# Patient Record
Sex: Female | Born: 1980 | Race: White | Hispanic: No | Marital: Single | Smoking: Current every day smoker
Health system: Southern US, Community
[De-identification: ages and names within clinical notes are randomized; demographics above are authoritative.]

## PROBLEM LIST (undated history)

## (undated) DIAGNOSIS — Z8669 Personal history of other diseases of the nervous system and sense organs: Secondary | ICD-10-CM

## (undated) DIAGNOSIS — E162 Hypoglycemia, unspecified: Secondary | ICD-10-CM

---

## 2018-03-23 ENCOUNTER — Encounter (HOSPITAL_COMMUNITY): Payer: Self-pay | Admitting: *Deleted

## 2018-03-23 ENCOUNTER — Emergency Department (HOSPITAL_COMMUNITY)
Admission: EM | Admit: 2018-03-23 | Discharge: 2018-03-23 | Disposition: A | Discharge status: Home / Self Care | Payer: Self-pay | Attending: Emergency Medicine | Admitting: Emergency Medicine

## 2018-03-23 ENCOUNTER — Other Ambulatory Visit: Payer: Self-pay

## 2018-03-23 DIAGNOSIS — H60331 Swimmer's ear, right ear: Secondary | ICD-10-CM | POA: Insufficient documentation

## 2018-03-23 DIAGNOSIS — F17210 Nicotine dependence, cigarettes, uncomplicated: Secondary | ICD-10-CM | POA: Insufficient documentation

## 2018-03-23 HISTORY — DX: Personal history of other diseases of the nervous system and sense organs: Z86.69

## 2018-03-23 HISTORY — DX: Hypoglycemia, unspecified: E16.2

## 2018-03-23 MED ORDER — CIPROFLOXACIN-DEXAMETHASONE 0.3-0.1 % OT SUSP
4.0000 [drp] | Freq: Three times a day (TID) | OTIC | Status: AC
  Administered 2018-03-23: 4 [drp] via OTIC
  Filled 2018-03-23: qty 7.5

## 2018-03-23 NOTE — ED Triage Notes (Addendum)
Pt c/o right ear pain, itching, decreased hearing progressing since Wednesday. Pt reports the pain was so bad last night she was dizzy and nauseated, pt denies these symptoms now.

## 2018-03-23 NOTE — Discharge Instructions (Signed)
Ciprodex drops - 4 drops in the right ear every 8 hours for 7 days Read attached instructions  [facility] Doctor List    Kari BaarsEdward Hawkins MD. Specialty: Pulmonary Disease Contact information: [address]  [address]  [place] [address]  [phone]   Syliva OvermanMargaret Simpson, MD. Specialty: [facility] Contact information: [address]  [place] [address]  [phone]   Lilyan PuntScott Luking, MD. Specialty: [facility] Contact information: [address]  [address]  [place] [address]  [phone]   Avon Gullyesfaye Fanta, MD Specialty: Internal Medicine Contact information: [address]  [place] [address]  [phone]   Catalina PizzaZach Hall, MD. Specialty: Internal Medicine Contact information: [address]  [place] [address]  [phone]    [facility] (Dr. Selena BattenKim) Specialty: Family Medicine Contact information: [address]  [place] [address]  [phone]   John GiovanniStephen Knowlton, MD. Specialty: [facility] Contact information: [address]  [address]  [place] [address]  [phone]   Carylon Perchesoy Fagan, MD. Specialty: Internal Medicine Contact information: [address]  [address]  [place] [address]  [phone]    [facility]  [address] [place] [address] [phone]  Services [facility] offers a variety of basic health services.  Services include but are not limited to: Blood pressure checks  Heart rate checks  Blood sugar checks  Urine analysis  Rapid strep tests  Pregnancy tests.  Health education and referrals  People needing more complex services will be directed to a physician online. Using these virtual visits, doctors can evaluate and prescribe medicine and treatments. There will be no medication on-site, though [facility] will help patients fill their prescriptions at little to no  cost.   For More information please go to: [email or web address]

## 2018-03-23 NOTE — ED Notes (Signed)
ED Provider at bedside. 

## 2018-03-23 NOTE — ED Provider Notes (Signed)
[facility] EMERGENCY DEPARTMENT Provider Note   CSN: [id number] Arrival date & time: 03/23/18  [id number]     History   Chief Complaint Chief Complaint  Patient presents with  . Otalgia    HPI Cassie BergeronJennifer Andrews is a 37 y.o. female.  HPI  37 year old female who has had pain for the last 36 hours or so, the pain is located in the right ear, it is worse with touching the ear, the pain extends down inferior to the ear onto the upper neck and there is no fevers.  She has no sore throat, she has no drainage from the ear, she has been swimming in the swimming pool at her apartment complex recently.  She has not been swimming for quite some time.  The pain is persistent, gradually worsening, became severe last night and she used ibuprofen and Aleve.  Did help with the pain  Past Medical History:  Diagnosis Date  . History of recurrent ear infection    as a child  . Hypoglycemia     There are no active problems to display for this patient.   Past Surgical History:  Procedure Laterality Date  . CESAREAN SECTION       OB History    Gravida  1   Para      Term      Preterm      AB      Living  1     SAB      TAB      Ectopic      Multiple      Live Births               Home Medications    Prior to Admission medications   Not on File    Family History No family history on file.  Social History Social History   Tobacco Use  . Smoking status: Current Every Day Smoker    Types: E-cigarettes  . Smokeless tobacco: Never Used  Substance Use Topics  . Alcohol use: Never    Frequency: Never  . Drug use: Never     Allergies   Patient has no known allergies.   Review of Systems Review of Systems  Constitutional: Negative for fever.  HENT: Positive for ear pain.      Physical Exam Updated Vital Signs BP 140/83 (BP Location: Left Arm)   Pulse 93   Temp 98.3 F (36.8 C) (Oral)   Resp 16   Ht 5\' 10"  (1.778 m)   Wt 104.3 kg   LMP 03/21/2018    SpO2 98%   BMI 33.00 kg/m   Physical Exam  Constitutional: She appears well-developed and well-nourished.  HENT:  Head: Normocephalic and atraumatic.  Oropharynx is clear and moist, nasal passages are clear, left tympanic membrane is well visualized, right tympanic membrane is difficult to visualize secondary to swelling of the external auditory canal, there is no discharge or drainage but there is pain with manipulation of the auricle and the tragus.  There is no trismus or torticollis  Eyes: Conjunctivae are normal. Right eye exhibits no discharge. Left eye exhibits no discharge.  Pulmonary/Chest: Effort normal. No respiratory distress.  Neurological: She is alert. Coordination normal.  Skin: Skin is warm and dry. No rash noted. She is not diaphoretic. No erythema.  Psychiatric: She has a normal mood and affect.  Nursing note and vitals reviewed.    ED Treatments / Results  Labs (all labs ordered are listed, but only  abnormal results are displayed) Labs Reviewed - No data to display  EKG None  Radiology No results found.  Procedures Procedures (including critical care time)  Medications Ordered in ED Medications  ciprofloxacin-dexamethasone (CIPRODEX) 0.3-0.1 % OTIC (EAR) suspension 4 drop (has no administration in time range)     Initial Impression / Assessment and Plan / ED Course  I have reviewed the triage vital signs and the nursing notes.  Pertinent labs & imaging results that were available during my care of the patient were reviewed by me and considered in my medical decision making (see chart for details).     The patient has acute otitis externa, there is no risk factors for malignant otitis externa, she will be given Ciprodex drops here and discharged home with the bottle.  I gave her instructions on their use and the indications for return, she expressed her understanding.  I also discussed with her limiting her use of ibuprofen and Aleve so as to avoid  toxicity of these medications  Final Clinical Impressions(s) / ED Diagnoses   Final diagnoses:  Acute swimmer's ear of right side    ED Discharge Orders    None       Eber Hong, MD 03/23/18 [phone]

## 2018-03-25 ENCOUNTER — Other Ambulatory Visit: Payer: Self-pay

## 2018-03-25 ENCOUNTER — Emergency Department (HOSPITAL_COMMUNITY)
Admission: EM | Admit: 2018-03-25 | Discharge: 2018-03-25 | Disposition: A | Discharge status: Home / Self Care | Payer: Self-pay | Attending: Emergency Medicine | Admitting: Emergency Medicine

## 2018-03-25 ENCOUNTER — Emergency Department (HOSPITAL_COMMUNITY): Payer: Self-pay

## 2018-03-25 ENCOUNTER — Encounter (HOSPITAL_COMMUNITY): Payer: Self-pay | Admitting: Emergency Medicine

## 2018-03-25 DIAGNOSIS — F17210 Nicotine dependence, cigarettes, uncomplicated: Secondary | ICD-10-CM | POA: Insufficient documentation

## 2018-03-25 DIAGNOSIS — H60331 Swimmer's ear, right ear: Secondary | ICD-10-CM | POA: Insufficient documentation

## 2018-03-25 MED ORDER — ACETAMINOPHEN 500 MG PO TABS
1000.0000 mg | ORAL_TABLET | Freq: Once | ORAL | Status: AC
  Administered 2018-03-25: 1000 mg via ORAL
  Filled 2018-03-25: qty 2

## 2018-03-25 MED ORDER — IBUPROFEN 600 MG PO TABS: 600.0000 mg | ORAL_TABLET | Freq: Four times a day (QID) | ORAL | 0 refills | Status: AC

## 2018-03-25 MED ORDER — OFLOXACIN 0.3 % OP SOLN
5.0000 [drp] | Freq: Every day | OPHTHALMIC | Status: DC
  Administered 2018-03-25: 5 [drp] via OTIC
  Filled 2018-03-25: qty 5

## 2018-03-25 MED ORDER — IBUPROFEN 800 MG PO TABS
800.0000 mg | ORAL_TABLET | Freq: Once | ORAL | Status: AC
  Administered 2018-03-25: 800 mg via ORAL
  Filled 2018-03-25: qty 1

## 2018-03-25 MED ORDER — CEPHALEXIN 500 MG PO CAPS: 500.0000 mg | ORAL_CAPSULE | Freq: Four times a day (QID) | ORAL | 0 refills | Status: AC

## 2018-03-25 MED ORDER — HYDROCODONE-ACETAMINOPHEN 5-325 MG PO TABS: 2.0000 | ORAL_TABLET | ORAL | 0 refills | Status: AC | PRN

## 2018-03-25 MED ORDER — CEPHALEXIN 500 MG PO CAPS
500.0000 mg | ORAL_CAPSULE | Freq: Once | ORAL | Status: AC
  Administered 2018-03-25: 500 mg via ORAL
  Filled 2018-03-25: qty 1

## 2018-03-25 MED ORDER — IOHEXOL 300 MG/ML  SOLN
75.0000 mL | Freq: Once | INTRAMUSCULAR | Status: AC | PRN
  Administered 2018-03-25: 75 mL via INTRAVENOUS

## 2018-03-25 NOTE — Discharge Instructions (Addendum)
Your examination and the CT scan of your ear and facial bones suggest external otitis.  No abscess found.  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  Please see Dr. Suszanne Connerseoh or the ear nose and throat specialist of your choice concerning this issue.  Please use Keflex and ibuprofen with breakfast, lunch, dinner, and at bedtime.  Use ofloxacin daily to the right ear for 5-7 days. Use 1 or 2 hydrocodone every 4-6 hours if needed for severe pain. This medication may cause drowsiness. Please do not drink, drive, or participate in activity that requires concentration while taking this medication.

## 2018-03-25 NOTE — ED Triage Notes (Signed)
Pt states she was dx with swimmers ear and using 2 tylenol and 3 motrin tablets q4h. Denies drainage or fever.

## 2018-03-25 NOTE — ED Notes (Signed)
Called [facility] for meds.  

## 2018-03-25 NOTE — ED Provider Notes (Signed)
[facility] EMERGENCY DEPARTMENT Provider Note   CSN: [id number] Arrival date & time: 03/25/18  1622     History   Chief Complaint Chief Complaint  Patient presents with  . Otalgia    HPI Cassie Andrews is a 37 y.o. female.  Patient is a 37 year old female who presents to the emergency department with a complaint of severe ear pain.  The patient states that she has been swimming at her apartment complex recently.  He developed ear pain.  She came to the emergency department for evaluation on August 23.  She was told that she had swimmer's ear.  She was placed on Ciprodex.  She has been on the Ciprodex for 3 days.  She says now that the pain involves her ear, her neck, and the right side of the face.  She denies any known dental issue.  She has not had any injury or trauma to the face or to the ear.  She has not had any recent operations or procedures.  It is of note that the patient is a smoker.  Patient denies any bleeding from the ear.  She says she has had some drainage that looked green from the ear.  Patient will be evaluated for this issue.  The history is provided by the patient.  Otalgia  Associated symptoms include headaches. Pertinent negatives include no abdominal pain, no neck pain and no cough.    Past Medical History:  Diagnosis Date  . History of recurrent ear infection    as a child  . Hypoglycemia     There are no active problems to display for this patient.   Past Surgical History:  Procedure Laterality Date  . CESAREAN SECTION       OB History    Gravida  1   Para      Term      Preterm      AB      Living  1     SAB      TAB      Ectopic      Multiple      Live Births               Home Medications    Prior to Admission medications   Not on File    Family History History reviewed. No pertinent family history.  Social History Social History   Tobacco Use  . Smoking status: Current Every Day Smoker    Types:  E-cigarettes  . Smokeless tobacco: Never Used  Substance Use Topics  . Alcohol use: Never    Frequency: Never  . Drug use: Never     Allergies   Patient has no known allergies.   Review of Systems Review of Systems  Constitutional: Positive for appetite change. Negative for activity change.       All ROS Neg except as noted in HPI  HENT: Positive for ear pain. Negative for nosebleeds.        FACIAL PAIN  Eyes: Negative for photophobia and discharge.  Respiratory: Negative for cough, shortness of breath and wheezing.   Cardiovascular: Negative for chest pain and palpitations.  Gastrointestinal: Negative for abdominal pain and blood in stool.  Genitourinary: Negative for dysuria, frequency and hematuria.  Musculoskeletal: Negative for arthralgias, back pain and neck pain.  Skin: Negative.   Neurological: Positive for headaches. Negative for dizziness, seizures and speech difficulty.  Psychiatric/Behavioral: Negative for confusion and hallucinations.     Physical Exam Updated Vital  Signs BP 137/88 (BP Location: Right Arm)   Pulse 94   Temp 97.9 F (36.6 C) (Temporal)   Resp 18   Wt 104.3 kg   LMP 03/21/2018   SpO2 100%   BMI 33.00 kg/m   Physical Exam  Constitutional: She is oriented to person, place, and time. She appears well-developed and well-nourished.  Non-toxic appearance.  HENT:  Head: Normocephalic.  Right Ear: Tympanic membrane and external ear normal.  Left Ear: Tympanic membrane and external ear normal.  There is mild to moderate nasal congestion present.  There is mild to moderate congestion of the nasal passages.  The sinus areas are not hot.  The oropharynx is clear.  The uvula is in the midline.  There is no exudate noted.  There is no evidence for abscess.  No trismus noted.  The left external ear is normal.  The external auditory canal on the left is normal.  The tympanic membrane appears normal.  The external right ear is normal.  The external  auditory canal shows swelling of the external auditory canal.  The area looks moist, but I cannot see any actual drainage or discharge from the canal.  The patient has pain with movement of the ear.  I cannot visualize the tympanic membrane at this time.  There is no preauricular nodes.  There is tenderness in the preauricular and postauricular area. There is pain to percussion of the right face.  Eyes: Pupils are equal, round, and reactive to light. EOM and lids are normal.  Neck: Normal range of motion. Neck supple. Carotid bruit is not present.  Cardiovascular: Normal rate, regular rhythm, normal heart sounds, intact distal pulses and normal pulses.  Pulmonary/Chest: Breath sounds normal. No respiratory distress.  Abdominal: Soft. Bowel sounds are normal. There is no tenderness. There is no guarding.  Musculoskeletal: Normal range of motion.  Lymphadenopathy:       Head (right side): No submandibular adenopathy present.       Head (left side): No submandibular adenopathy present.    She has no cervical adenopathy.  Neurological: She is alert and oriented to person, place, and time. She has normal strength. No cranial nerve deficit or sensory deficit.  Skin: Skin is warm and dry. No rash noted.  Psychiatric: She has a normal mood and affect. Her speech is normal.  Nursing note and vitals reviewed.    ED Treatments / Results  Labs (all labs ordered are listed, but only abnormal results are displayed) Labs Reviewed - No data to display  EKG None  Radiology No results found.  Procedures Procedures (including critical care time)  Medications Ordered in ED Medications - No data to display   Initial Impression / Assessment and Plan / ED Course  I have reviewed the triage vital signs and the nursing notes.  Pertinent labs & imaging results that were available during my care of the patient were reviewed by me and considered in my medical decision making (see chart for details).         Final Clinical Impressions(s) / ED Diagnoses mdm Vital signs are within normal limits.  Pulse oximetry is 100% on room air.  Within normal limits by my interpretation.  The patient has mild nasal congestion.  She has facial tenderness.  She has pre-and postauricular tenderness, however there is no redness or swelling of the mastoid area.  The patient gives a history of drainage from the ear, and a previous diagnosis of otitis externa.  She has been  on Ciprodex for 3 days, and her symptoms seem to be worsening.  Will obtain a CT maxillofacial tissues. Patient is driving.  Patient treated in the emergency department with ibuprofen and Tylenol for pain.  He vital signs monitored.  No temperature elevation while in the emergency department.  No tachycardia noted.  CT scan shows findings compatible with right otitis externa.  There is no abscess or mass appreciated.  There are reactive right-sided lymph nodes including periparotid lymph nodes present.  There is some soft tissue abnormality in thePrussak's space that may be consistent with a developing cholesteatoma.  Patient treated with antibiotic oral therapy.  Have asked the patient to continue her Ciprodex.  I have ordered prescription for pain medication.  I have strongly suggested that the patient see your nose and throat for further evaluation of the otitis externa, as well as evaluation of the possible developing cholesteatoma.  Questions were answered.  The patient is in agreement with this plan.     Final diagnoses:  Acute swimmer's ear of right side    ED Discharge Orders         Ordered    cephALEXin (KEFLEX) 500 MG capsule  4 times daily     03/25/18 2150    ibuprofen (ADVIL,MOTRIN) 600 MG tablet  4 times daily     03/25/18 2150    HYDROcodone-acetaminophen (NORCO/VICODIN) 5-325 MG tablet  Every 4 hours PRN     03/25/18 2150           Ivery QualeBryant, Wynonna Fitzhenry, PA-C 03/28/18 1348    Vanetta MuldersZackowski, Scott, MD 04/01/18 1001

## 2018-03-26 MED FILL — Hydrocodone-Acetaminophen Tab 5-325 MG: ORAL | Qty: 6 | Status: AC

## 2020-03-09 IMAGING — CT CT MAXILLOFACIAL W/ CM
3 series · 15 of 47 positions shown, 18 images · IV contrast (Isovue)
Comparison: None.

CLINICAL DATA: Severe right facial pain. Right ear pain. Otitis
externa.

EXAM:
CT MAXILLOFACIAL WITH CONTRAST
TECHNIQUE: Multidetector CT imaging of the maxillofacial structures was
performed with intravenous contrast. Multiplanar CT image
reconstructions were also generated.
CONTRAST:  75mL OMNIPAQUE IOHEXOL 300 MG/ML  SOLN

[Series 2: max soft · axial · 0.36mm/px · z∈[+74,+220]mm · 9 of 85 slices shown, 12 images]
[im 6/85  brain]
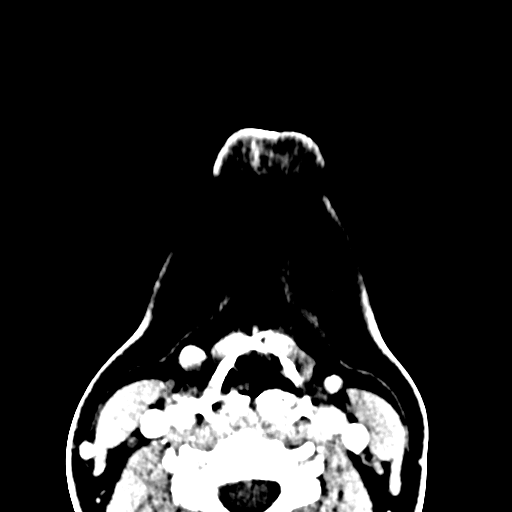
[im 6/85  bone]
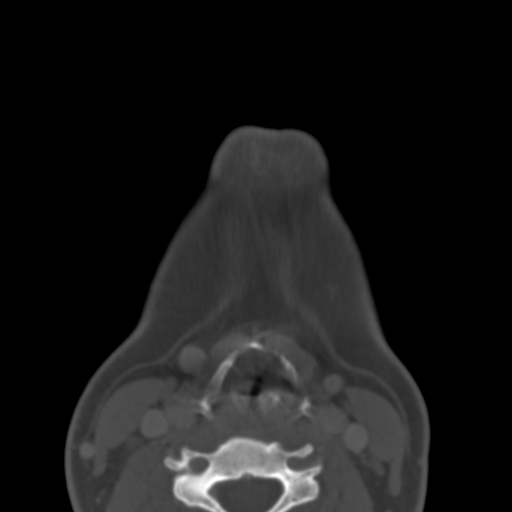
[im 15/85  bone]
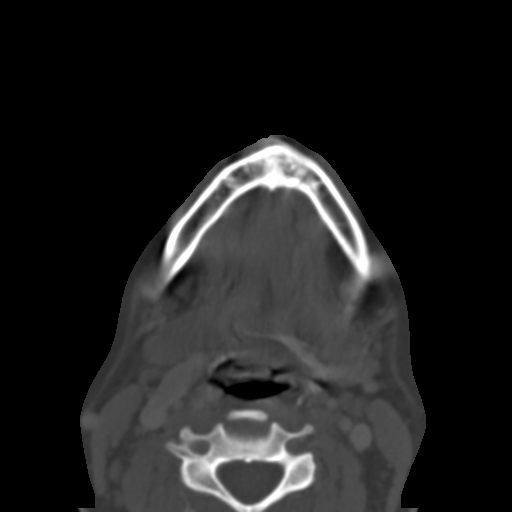
[im 24/85  bone]
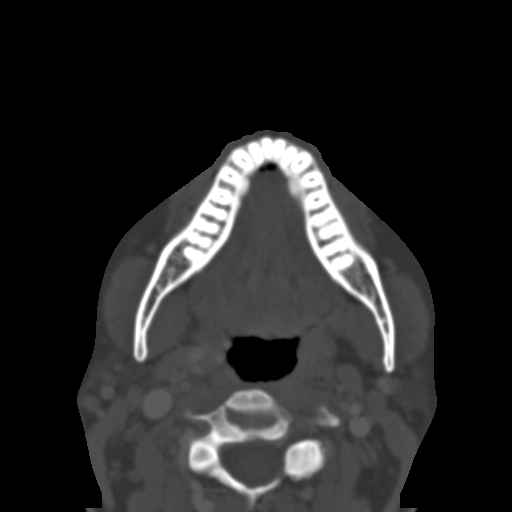
[im 32/85  bone]
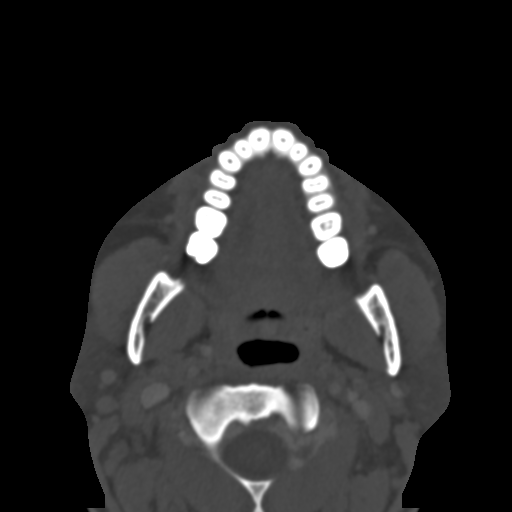
[im 44/85  brain]
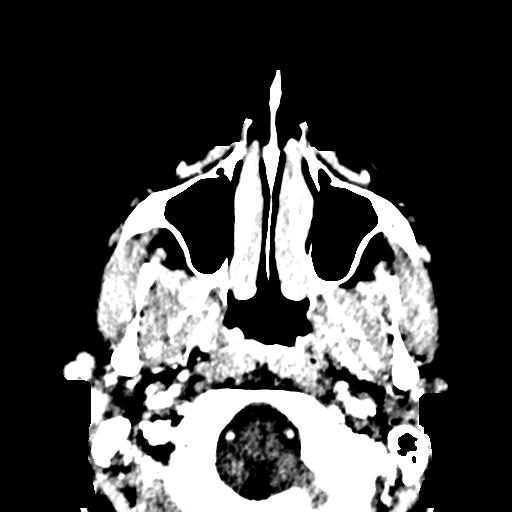
[im 44/85  bone]
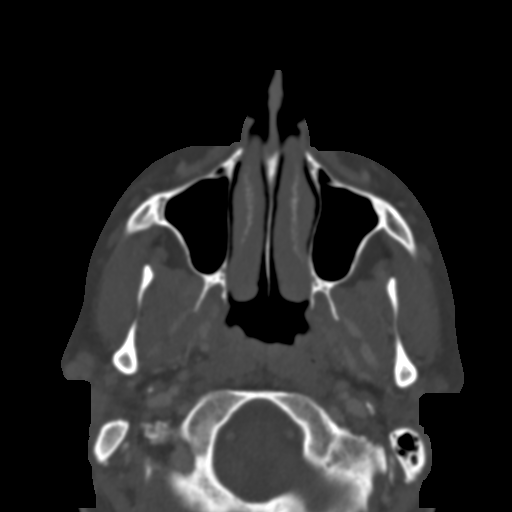
[im 53/85  bone]
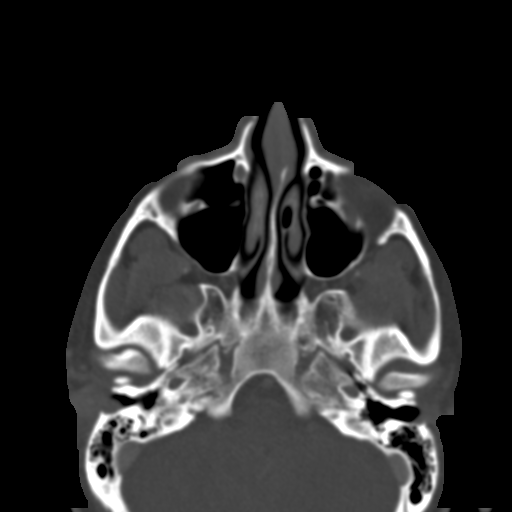
[im 61/85  bone]
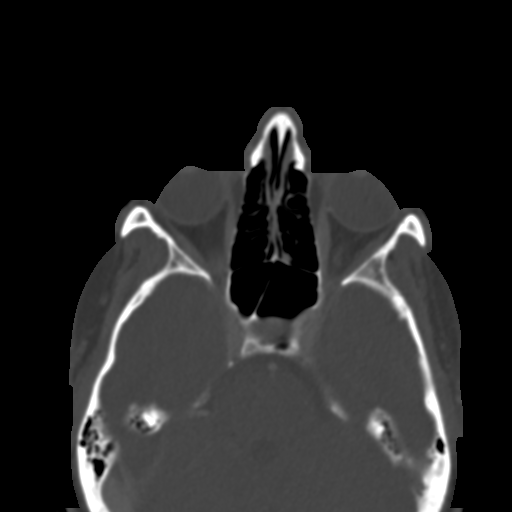
[im 70/85  bone]
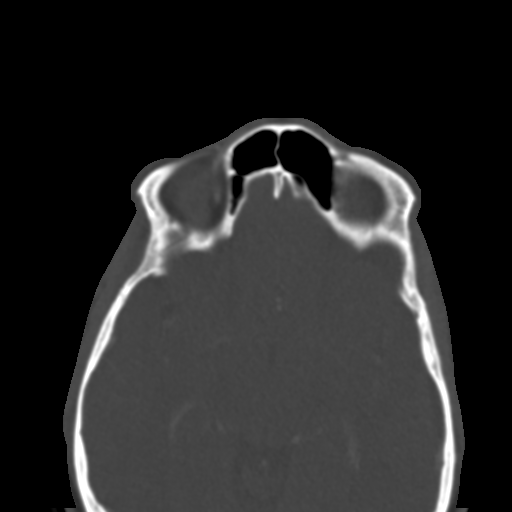
[im 79/85  brain]
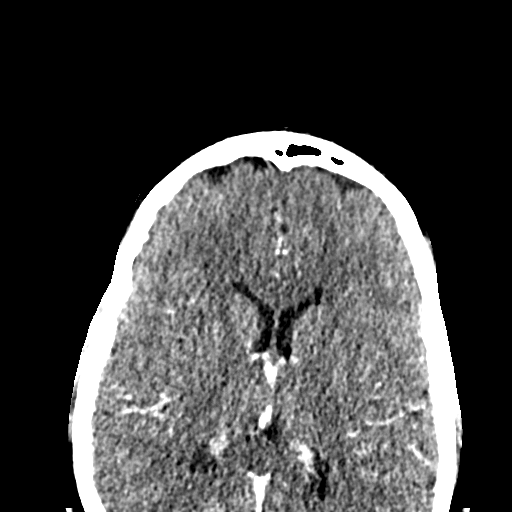
[im 79/85  bone]
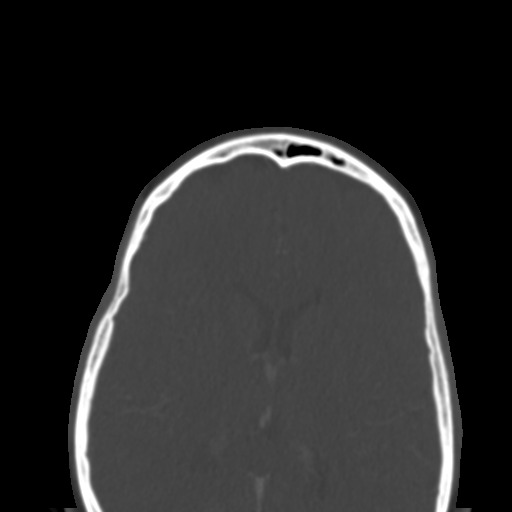

[Series 6: coronal soft · coronal · 0.36mm/px · 3 of 80 slices shown]
[im 27/80  bone]
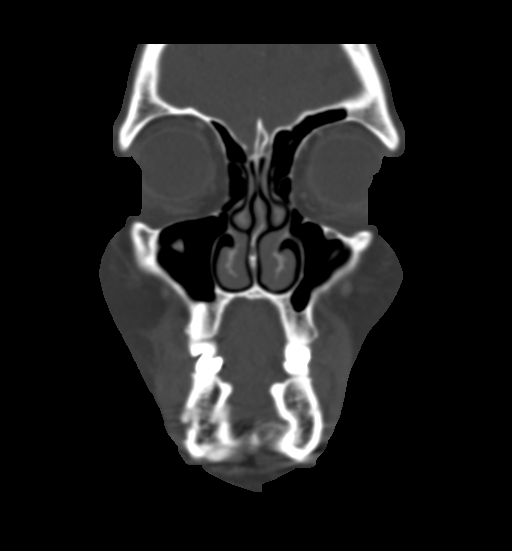
[im 36/80  bone]
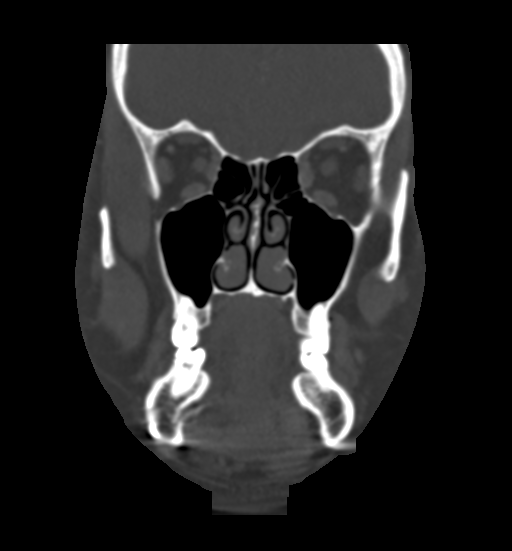
[im 44/80  bone]
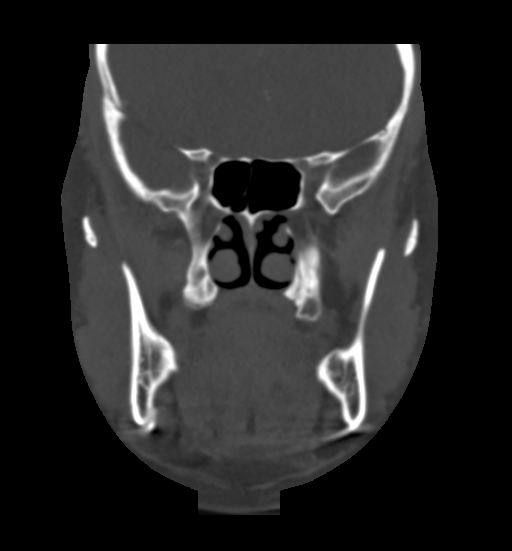

[Series 7: sagittal soft · sagittal · 0.34mm/px · 3 of 80 slices shown]
[im 27/80  bone]
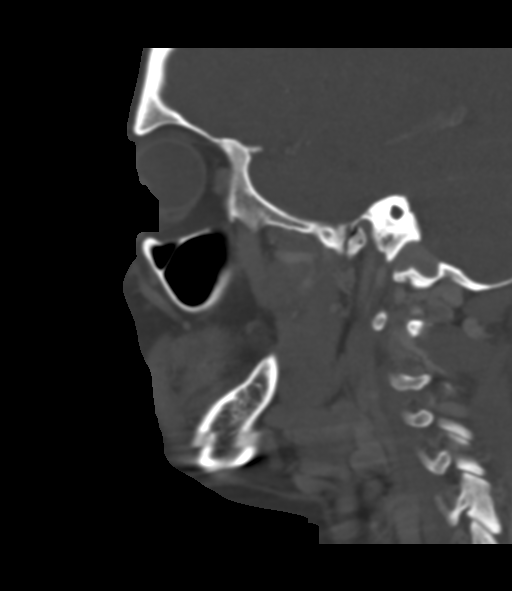
[im 40/80  bone]
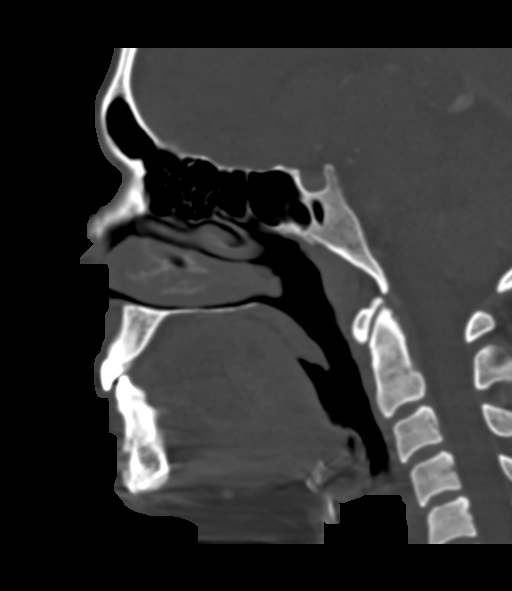
[im 53/80  bone]
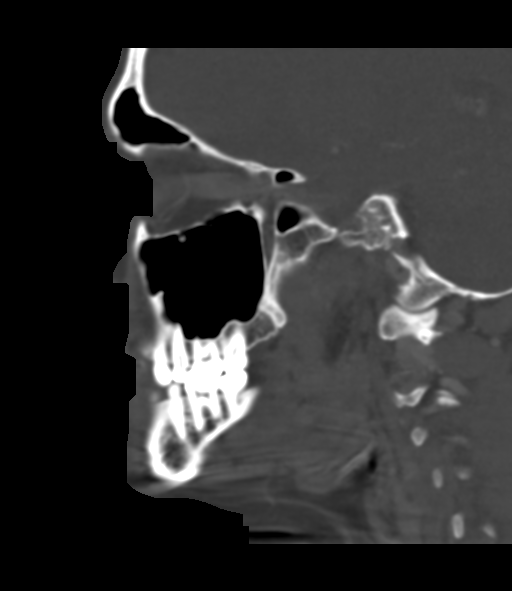

[15 of 47 positions shown; findings below may reference images not displayed]

FINDINGS: Osseous: The external and internal air structures are within normal
limits. Facial bones are unremarkable. Mandible is intact and
located. The upper cervical spine is within normal limits focal
lytic or blastic lesions are present.

Orbits: Globes and orbits are within normal limits.

Sinuses: The paranasal sinuses are clear. Mastoid air cells are
clear bilaterally. Minimal fluid or soft tissue is present in the
right middle ear cavity within Prussak's space.

Soft tissues: Inflammatory changes are present right external
auditory canal. This is mostly limited to the membranous portion.
This narrows the right external auditory canal. Adjacent lymph nodes
are present inferiorly, adjacent to the right parotid gland.
Salivary glands are otherwise within normal limits. Muscles of
mastication are unremarkable. The nodes are reactive.

Limited intracranial: Within normal limits.
IMPRESSION: 1. Findings compatible with right otitis externa.
2. No abscess or mass lesion.
3. Reactive right-sided lymph nodes including peri-parotid lymph
nodes.
4. Focal soft tissue within Prussak's space may represent early
acquired cholesteatoma. Recommend follow-up with ENT.

## 2021-04-10 ENCOUNTER — Emergency Department: Admission: EM | Admit: 2021-04-10 | Payer: Self-pay | Source: Home / Self Care

## 2023-08-14 ENCOUNTER — Ambulatory Visit
Admission: EM | Admit: 2023-08-14 | Discharge: 2023-08-14 | Disposition: A | Discharge status: Home / Self Care | Payer: Self-pay

## 2023-08-14 DIAGNOSIS — J020 Streptococcal pharyngitis: Secondary | ICD-10-CM

## 2023-08-14 LAB — POCT RAPID STREP A (OFFICE): Rapid Strep A Screen: POSITIVE — AB

## 2023-08-14 MED ORDER — LIDOCAINE VISCOUS HCL 2 % MT SOLN: OROMUCOSAL | 0 refills | Status: AC

## 2023-08-14 MED ORDER — AMOXICILLIN-POT CLAVULANATE 875-125 MG PO TABS: 1.0000 | ORAL_TABLET | Freq: Two times a day (BID) | ORAL | 0 refills | Status: AC

## 2023-08-14 NOTE — Discharge Instructions (Addendum)
 The rapid strep test was positive.  Take medication as prescribed. Increase fluids and allow for plenty of rest. Recommend over-the-counter  Tylenol or ibuprofen as needed for pain, fever, or general discomfort. Warm salt water gargles 3-4 times daily to help with throat pain or discomfort. May use Chloroseptic throat spray or throat lozenges for pain or discomfort. Recommend a diet with soft foods to include soups, broths, puddings, yogurt, Jell-O's, or popsicles until symptoms improve. Change toothbrush after 3 days. Follow-up if symptoms do not improve.

## 2023-08-14 NOTE — ED Provider Notes (Signed)
 [facility]    CSN: [id number] Arrival date & time: 08/14/23  1445      History   Chief Complaint No chief complaint on file.   HPI Cassie Andrews is a 43 y.o. female.   The history is provided by the patient.   Patient presents for complaints of sore throat, swollen glands, hoarseness, and pain with swallowing over the past 24 to 48 hours.  Patient also complains of pain in the right ear, and pain that goes down the right side of her neck.  She reports that she is unsure if she has had fever because she has been taking ibuprofen .  Denies headache, ear drainage, cough, chest pain, abdominal pain, nausea, vomiting, diarrhea, or rash.  Patient denies any obvious known sick contacts.  Past Medical History:  Diagnosis Date   History of recurrent ear infection    as a child   Hypoglycemia     There are no active problems to display for this patient.   Past Surgical History:  Procedure Laterality Date   CESAREAN SECTION      OB History     Gravida  1   Para      Term      Preterm      AB      Living  1      SAB      IAB      Ectopic      Multiple      Live Births               Home Medications    Prior to Admission medications   Medication Sig Start Date End Date Taking? Authorizing Provider  amoxicillin -clavulanate (AUGMENTIN ) 875-125 MG tablet Take 1 tablet by mouth every 12 (twelve) hours. 08/14/23  Yes Leath-Warren, Etta PARAS, NP  cetirizine (ZYRTEC) 10 MG tablet Take 10 mg by mouth daily.   Yes [provider]  lidocaine  (XYLOCAINE ) 2 % solution Gargle and spit 5mL every 6 hours prn for throat pain or discomfort. 08/14/23  Yes Leath-Warren, Etta PARAS, NP  norgestrel-ethinyl estradiol (LO/OVRAL) 0.3-30 MG-MCG tablet Take 1 tablet by mouth daily.   Yes [provider]  cephALEXin  (KEFLEX ) 500 MG capsule Take 1 capsule (500 mg total) by mouth 4 (four) times daily. 03/25/18   Armida Culver, PA-C   HYDROcodone -acetaminophen  (NORCO/VICODIN) 5-325 MG tablet Take 2 tablets by mouth every 4 (four) hours as needed. 03/25/18   Armida Culver, PA-C  ibuprofen  (ADVIL ,MOTRIN ) 600 MG tablet Take 1 tablet (600 mg total) by mouth 4 (four) times daily. 03/25/18   Armida Culver, PA-C    Family History History reviewed. No pertinent family history.  Social History Social History   Tobacco Use   Smoking status: Every Day    Types: E-cigarettes   Smokeless tobacco: Never  Vaping Use   Vaping status: Every Day  Substance Use Topics   Alcohol use: Never   Drug use: Never     Allergies   Patient has no known allergies.   Review of Systems Review of Systems Per HPI  Physical Exam Triage Vital Signs ED Triage Vitals  Encounter Vitals Group     BP 08/14/23 1512 137/78     Systolic BP Percentile --      Diastolic BP Percentile --      Pulse Rate 08/14/23 1512 (!) 106     Resp 08/14/23 1512 17     Temp 08/14/23 1512 98.9 F (37.2 C)  Temp Source 08/14/23 1512 Oral     SpO2 08/14/23 1512 97 %     Weight --      Height --      Head Circumference --      Peak Flow --      Pain Score 08/14/23 1517 5     Pain Loc --      Pain Education --      Exclude from Growth Chart --    No data found.  Updated Vital Signs BP 137/78 (BP Location: Right Arm)   Pulse (!) 106   Temp 98.9 F (37.2 C) (Oral)   Resp 17   LMP 07/20/2023 (Within Days)   SpO2 97%   Visual Acuity Right Eye Distance:   Left Eye Distance:   Bilateral Distance:    Right Eye Near:   Left Eye Near:    Bilateral Near:     Physical Exam Vitals and nursing note reviewed.  Constitutional:      General: She is not in acute distress.    Appearance: Normal appearance.  HENT:     Head: Normocephalic.     Right Ear: Tympanic membrane, ear canal and external ear normal.     Left Ear: Tympanic membrane, ear canal and external ear normal.     Nose: Congestion present.     Right Turbinates: Enlarged and swollen.      Left Turbinates: Enlarged and swollen.     Right Sinus: No maxillary sinus tenderness or frontal sinus tenderness.     Left Sinus: No maxillary sinus tenderness or frontal sinus tenderness.     Mouth/Throat:     Lips: Pink.     Mouth: Mucous membranes are moist.     Pharynx: Pharyngeal swelling, oropharyngeal exudate, posterior oropharyngeal erythema and postnasal drip present.     Tonsils: Tonsillar exudate (left tonsil) present. 1+ on the right. 1+ on the left.  Eyes:     Extraocular Movements: Extraocular movements intact.     Conjunctiva/sclera: Conjunctivae normal.     Pupils: Pupils are equal, round, and reactive to light.  Cardiovascular:     Rate and Rhythm: Regular rhythm. Tachycardia present.     Pulses: Normal pulses.     Heart sounds: Normal heart sounds.  Pulmonary:     Effort: Pulmonary effort is normal.     Breath sounds: Normal breath sounds.  Abdominal:     General: Bowel sounds are normal.     Palpations: Abdomen is soft.     Tenderness: There is no abdominal tenderness.  Musculoskeletal:     Cervical back: Normal range of motion.  Lymphadenopathy:     Cervical: No cervical adenopathy.  Skin:    General: Skin is warm and dry.  Neurological:     General: No focal deficit present.     Mental Status: She is alert and oriented to person, place, and time.  Psychiatric:        Mood and Affect: Mood normal.        Behavior: Behavior normal.      UC Treatments / Results  Labs (all labs ordered are listed, but only abnormal results are displayed) Labs Reviewed  POCT RAPID STREP A (OFFICE) - Abnormal; Notable for the following components:      Result Value   Rapid Strep A Screen Positive (*)    All other components within normal limits    EKG   Radiology No results found.  Procedures Procedures (including critical care time)  Medications Ordered in UC Medications - No data to display  Initial Impression / Assessment and Plan / UC Course  I  have reviewed the triage vital signs and the nursing notes.  Pertinent labs & imaging results that were available during my care of the patient were reviewed by me and considered in my medical decision making (see chart for details).  The rapid strep test was positive.  Will treat with Augmentin  875/125 mg tablets twice daily for the next 7 days.  Viscous lidocaine  2% provided for patient to gargle and spit for throat pain or discomfort.  Supportive care recommendations were provided and discussed with the patient to include fluids, rest, over-the-counter analgesics, a soft diet, warm salt water gargles, and discarding her toothbrush after 3 days.  Discussed indications regarding follow-up.  Patient was in agreement with this plan of care and verbalizes understanding.  All questions were answered.  Patient stable for discharge.  Work note was provided.  Final Clinical Impressions(s) / UC Diagnoses   Final diagnoses:  Streptococcal sore throat     Discharge Instructions      The rapid strep test was positive.  Take medication as prescribed. Increase fluids and allow for plenty of rest. Recommend over-the-counter  Tylenol  or ibuprofen  as needed for pain, fever, or general discomfort. Warm salt water gargles 3-4 times daily to help with throat pain or discomfort. May use Chloroseptic throat spray or throat lozenges for pain or discomfort. Recommend a diet with soft foods to include soups, broths, puddings, yogurt, Jell-O's, or popsicles until symptoms improve. Change toothbrush after 3 days. Follow-up if symptoms do not improve.      ED Prescriptions     Medication Sig Dispense Auth. Provider   amoxicillin -clavulanate (AUGMENTIN ) 875-125 MG tablet Take 1 tablet by mouth every 12 (twelve) hours. 14 tablet Leath-Warren, Etta PARAS, NP   lidocaine  (XYLOCAINE ) 2 % solution Gargle and spit 5mL every 6 hours prn for throat pain or discomfort. 100 mL Leath-Warren, Etta PARAS, NP      PDMP  not reviewed this encounter.   Gilmer Etta PARAS, NP 08/14/23 1709

## 2023-08-14 NOTE — ED Triage Notes (Addendum)
 Pt reports sore throat, swollen glands, hoarseness difficulty swallowing x 1 day along with ear pain the feel like they are throbbing, pain goes down into the neck.  Ibuprofen has helped with pain when it wears off pain comes back.

## 2024-01-12 ENCOUNTER — Ambulatory Visit: Payer: Self-pay | Admitting: Allergy
